# Patient Record
Sex: Female | Born: 1980 | Race: White | Hispanic: No | Marital: Married | Smoking: Former smoker
Health system: Southern US, Community
[De-identification: ages and names within clinical notes are randomized; demographics above are authoritative.]

## PROBLEM LIST (undated history)

## (undated) DIAGNOSIS — O1490 Unspecified pre-eclampsia, unspecified trimester: Secondary | ICD-10-CM

## (undated) DIAGNOSIS — E079 Disorder of thyroid, unspecified: Secondary | ICD-10-CM

## (undated) HISTORY — PX: WISDOM TOOTH EXTRACTION: SHX21

## (undated) HISTORY — DX: Disorder of thyroid, unspecified: E07.9

## (undated) HISTORY — DX: Unspecified pre-eclampsia, unspecified trimester: O14.90

---

## 2009-04-18 ENCOUNTER — Ambulatory Visit: Payer: Self-pay | Admitting: Family Medicine

## 2009-04-18 DIAGNOSIS — R5383 Other fatigue: Secondary | ICD-10-CM

## 2009-04-18 DIAGNOSIS — M25569 Pain in unspecified knee: Secondary | ICD-10-CM

## 2009-04-18 DIAGNOSIS — R5381 Other malaise: Secondary | ICD-10-CM | POA: Insufficient documentation

## 2009-04-21 ENCOUNTER — Encounter: Payer: Self-pay | Admitting: Family Medicine

## 2010-03-31 LAB — CONVERTED CEMR LAB: Pap Smear: NORMAL

## 2010-07-24 ENCOUNTER — Ambulatory Visit: Payer: Self-pay | Admitting: Family Medicine

## 2010-07-26 ENCOUNTER — Encounter: Payer: Self-pay | Admitting: Family Medicine

## 2010-07-27 LAB — CONVERTED CEMR LAB
ALT: 9 units/L (ref 0–35)
AST: 15 units/L (ref 0–37)
Alkaline Phosphatase: 34 units/L — ABNORMAL LOW (ref 39–117)
LDL Cholesterol: 103 mg/dL — ABNORMAL HIGH (ref 0–99)
Sodium: 139 meq/L (ref 135–145)
Total Bilirubin: 0.6 mg/dL (ref 0.3–1.2)
Total Protein: 7 g/dL (ref 6.0–8.3)
VLDL: 11 mg/dL (ref 0–40)

## 2010-08-29 ENCOUNTER — Encounter: Admission: RE | Admit: 2010-08-29 | Discharge: 2010-08-29 | Payer: Self-pay | Admitting: Family Medicine

## 2010-08-29 ENCOUNTER — Ambulatory Visit: Payer: Self-pay | Admitting: Family Medicine

## 2010-08-29 DIAGNOSIS — R030 Elevated blood-pressure reading, without diagnosis of hypertension: Secondary | ICD-10-CM | POA: Insufficient documentation

## 2010-10-31 NOTE — Assessment & Plan Note (Signed)
Summary: Chest pain, elevated BP   Vital Signs:  Patient profile:   30 year old female Height:      62.5 inches Weight:      141 pounds Pulse rate:   83 / minute BP sitting:   155 / 99  (right arm)  Vitals Entered By: Avon Gully CMA, Duncan Dull) (August 29, 2010 1:08 PM) CC: f/u BP and pt took an xray of her chest and saw something odd and now wants chest xray   Primary Care Provider:  Nani Gasser, MD  CC:  f/u BP and pt took an xray of her chest and saw something odd and now wants chest xray.  History of Present Illness: f/u BP and pt took an xray of her chest and saw something odd and now wants chest xray. Former smoker. Was initially having CP on the top of her lungs right and left.  Took an xray at work and saw a Chiropractor.  She is very nervous about it.  BP has been 120/70s.  She brought in a list of readings.  No SOB or CP. Works out regularly and does not have CP with this.   Current Medications (verified): 1)  None  Allergies (verified): No Known Drug Allergies  Comments:  Nurse/Medical Assistant: The patient's medications and allergies were reviewed with the patient and were updated in the Medication and Allergy Lists. Avon Gully CMA, Duncan Dull) (August 29, 2010 1:08 PM)  Physical Exam  General:  Well-developed,well-nourished,in no acute distress; alert,appropriate and cooperative throughout examination Head:  Normocephalic and atraumatic without obvious abnormalities. No apparent alopecia or balding. Chest Wall:  No deformities, masses, or tenderness noted. Lungs:  Normal respiratory effort, chest expands symmetrically. Lungs are clear to auscultation, no crackles or wheezes. Heart:  Normal rate and regular rhythm. S1 and S2 normal without gallop, murmur, click, rub or other extra sounds. Skin:  no rashes.   Psych:  Cognition and judgment appear intact. Alert and cooperative with normal attention span and concentration. No apparent delusions, illusions,  hallucinations   Impression & Recommendations:  Problem # 1:  DYSPNEA (ICD-786.05) Will get xray to further evaluate the lungs and ot rule out any problems. she is a former smoker.   Orders: T-Chest x-ray, 2 views (71020)  Problem # 2:  ELEVATED BP READING WITHOUT DX HYPERTENSION (ICD-796.2) Discussed recommend f/u in one month for BP recheck. Home BPs have been great.   Patient Instructions: 1)  We will call you with your results.   Orders Added: 1)  T-Chest x-ray, 2 views [71020] 2)  Est. Patient Level III [16109]

## 2010-10-31 NOTE — Assessment & Plan Note (Signed)
Summary: CPE   Vital Signs:  Patient profile:   30 year old female Height:      62.5 inches Weight:      138 pounds BMI:     24.93 Pulse rate:   94 / minute BP sitting:   145 / 98  (right arm) Cuff size:   regular  Vitals Entered By: Avon Gully CMA, Duncan Dull) (July 24, 2010 2:35 PM) CC: CPE   Primary Care Provider:  Nani Gasser, MD  CC:  CPE.  History of Present Illness: Energy level is better. She has lost some of her baby weight. She is doing welloverall. Here for CPE. Had her pap this summer. Saw Derm this summer as well.   Current Medications (verified): 1)  None  Allergies (verified): No Known Drug Allergies  Comments:  Nurse/Medical Assistant: The patient's medications and allergies were reviewed with the patient and were updated in the Medication and Allergy Lists. Avon Gully CMA, Duncan Dull) (July 24, 2010 2:35 PM)  Past History:  Past Surgical History: Wisdom teeth removed.   Family History: Father wtih HTN GM with HTN  Social History: Reviewed history from 04/18/2009 and no changes required. 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  Married to [employer].  with one son.   Former Smoker Alcohol use-yes Drug use-no Regular exercise-yes 2 caffeinated drinks per day.   Review of Systems  The patient denies anorexia, fever, weight loss, weight gain, vision loss, decreased hearing, hoarseness, chest pain, syncope, dyspnea on exertion, peripheral edema, prolonged cough, headaches, hemoptysis, abdominal pain, melena, hematochezia, severe indigestion/heartburn, hematuria, incontinence, genital sores, muscle weakness, suspicious skin lesions, transient blindness, difficulty walking, depression, unusual weight change, abnormal bleeding, enlarged lymph nodes, and breast masses.    Physical Exam  General:  Well-developed,well-nourished,in no acute distress; alert,appropriate and cooperative throughout examination Head:  Normocephalic  and atraumatic without obvious abnormalities. No apparent alopecia or balding. Eyes:  No corneal or conjunctival inflammation noted. EOMI. Perrla.  Ears:  External ear exam shows no significant lesions or deformities.  Otoscopic examination reveals clear canals, tympanic membranes are intact bilaterally without bulging, retraction, inflammation or discharge. Hearing is grossly normal bilaterally. Nose:  External nasal examination shows no deformity or inflammation. Nasal mucosa are pink and moist without lesions or exudates. Mouth:  Oral mucosa and oropharynx without lesions or exudates.  Teeth in good repair. Neck:  No deformities, masses, or tenderness noted. Chest Wall:  No deformities, masses, or tenderness noted. Lungs:  Normal respiratory effort, chest expands symmetrically. Lungs are clear to auscultation, no crackles or wheezes. Heart:  Normal rate and regular rhythm. S1 and S2 normal without gallop, murmur, click, rub or other extra sounds. Abdomen:  Bowel sounds positive,abdomen soft and non-tender without masses, organomegaly or hernias noted. Msk:  No deformity or scoliosis noted of thoracic or lumbar spine.   Pulses:  R and L carotid,radial,dorsalis pedis and posterior tibial pulses are full and equal bilaterally Extremities:  No clubbing, cyanosis, edema, or deformity noted with normal full range of motion of all joints.   Neurologic:  No cranial nerve deficits noted. Station and gait are normal. DTRs are symmetrical throughout. Sensory, motor and coordinative functions appear intact. Skin:  no rashes.   Cervical Nodes:  No lymphadenopathy noted Psych:  Cognition and judgment appear intact. Alert and cooperative with normal attention span and concentration. No apparent delusions, illusions, hallucinations   Impression & Recommendations:  Problem # 1:  HEALTH MAINTENANCE EXAM (ICD-V70.0) Exam is normal today.  Given  flu vac today.  Due for screening labs Healthy BMI. Keep up the  reg exercise.  Also has form for me to complet for her work once I recieve her labs.   Orders: T-Comprehensive Metabolic Panel [id number]) T-Lipid Profile [id number])  Other Orders: Flu Vaccine 69yrs + (01601) Admin 1st Vaccine (09323)   Patient Instructions: 1)  Google the DASH diet (.nih/org) for blood pressure. Check at work once a week and call with the numbers in one month.  2)  It is important that you exercise reguarly at least 20 minutes 5 times a week. If you develop chest pain, have severe difficulty breathing, or feel very tired, stop exercising immediately and seek medical attention.  3)  Take calcium +vitamin D daily.  4)  You were given a flu vaccine today.    Orders Added: 1)  T-Comprehensive Metabolic Panel [[id number]] 2)  T-Lipid Profile [[id number]] 3)  Flu Vaccine 59yrs + [[id number]] 4)  Admin 1st Vaccine [90471] 5)  Est. Patient age 5-39 [24]   Immunizations Administered:  Influenza Vaccine # 1:    Vaccine Type: Fluvax 3+    Site: right deltoid    Mfr: GlaxoSmithKline    Dose: 0.5 ml    Route: IM    Given by: Sue Lush McCrimmon CMA, (AAMA)    Exp. Date: 03/31/2011    Lot #: [id number]    VIS given: 04/25/10 version given July 24, 2010.  Flu Vaccine Consent Questions:    Do you have a history of severe allergic reactions to this vaccine? no    Any prior history of allergic reactions to egg and/or gelatin? no    Do you have a sensitivity to the preservative Thimersol? no    Do you have a past history of Guillan-Barre Syndrome? no    Do you currently have an acute febrile illness? no    Have you ever had a severe reaction to latex? no    Vaccine information given and explained to patient? no    Are you currently pregnant? no   Immunizations Administered:  Influenza Vaccine # 1:    Vaccine Type: Fluvax 3+    Site: right deltoid    Mfr: GlaxoSmithKline    Dose: 0.5 ml    Route: IM    Given by: Sue Lush McCrimmon CMA, (AAMA)    Exp. Date:  03/31/2011    Lot #: [id number]    VIS given: 04/25/10 version given July 24, 2010.  PAP Result Date:  03/31/2010 PAP Result:  normal

## 2011-06-14 IMAGING — CR DG CHEST 2V
2 series · 2 of 2 positions shown · non-contrast
Comparison: None

CLINICAL DATA: Shortness of breath, chest pain.

CHEST - 2 VIEW

[view not recorded (1 of 2)]
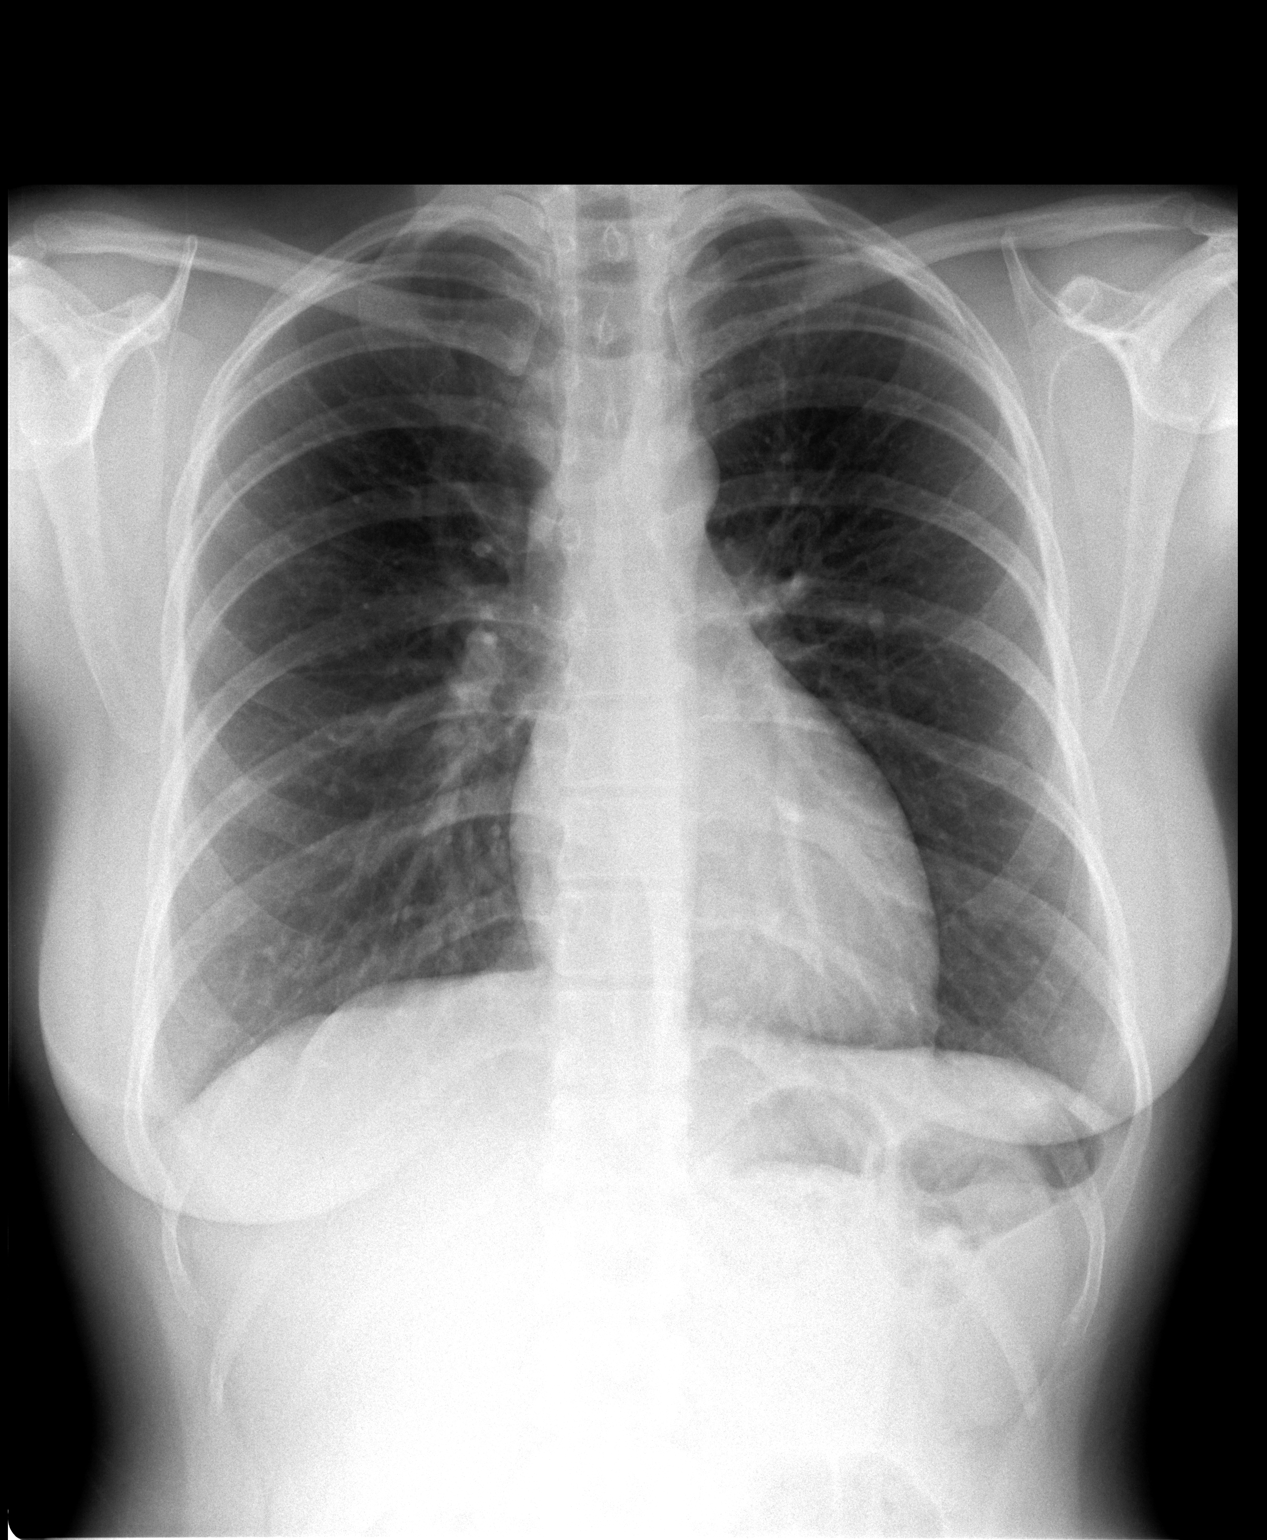

[view not recorded (2 of 2)]
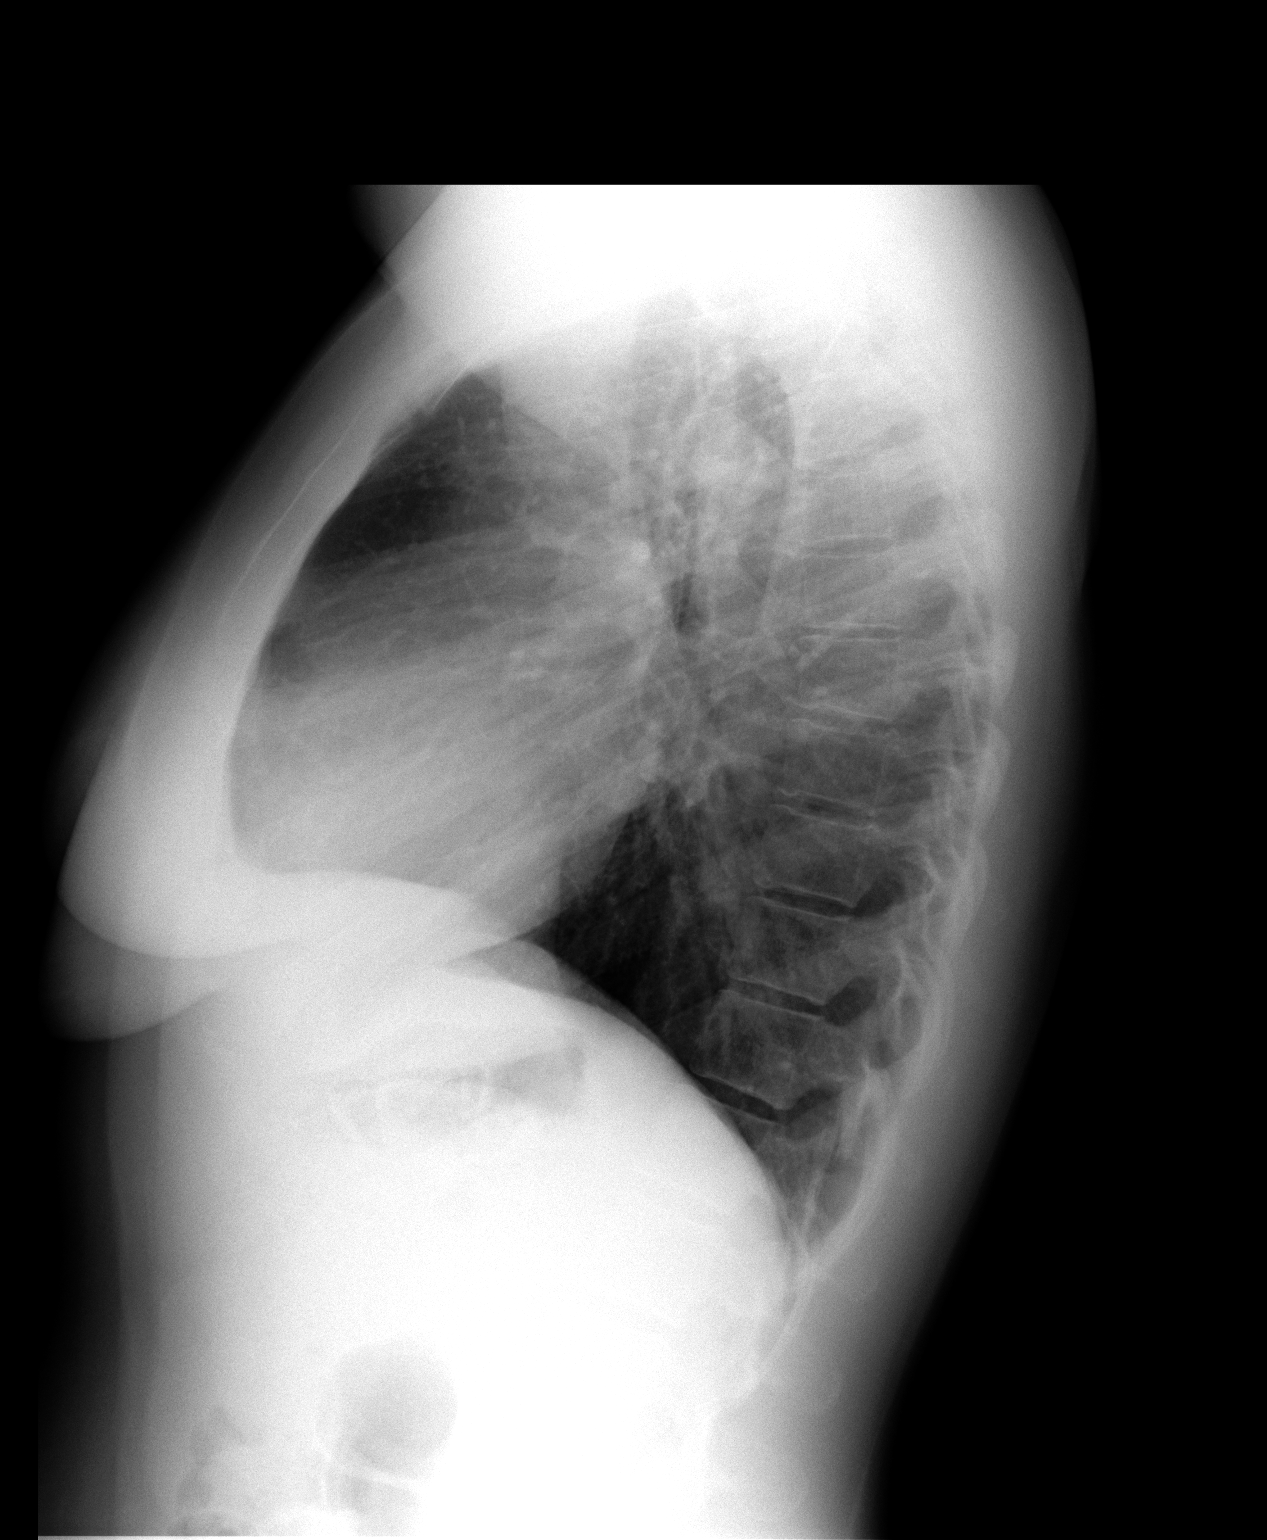

[2 of 2 positions shown; findings below may reference images not displayed]

FINDINGS: Heart and mediastinal contours are within normal limits.
No focal opacities or effusions.  No acute bony abnormality.
IMPRESSION: No active disease.

## 2011-12-13 ENCOUNTER — Encounter: Payer: Self-pay | Admitting: Family Medicine

## 2011-12-13 ENCOUNTER — Ambulatory Visit (INDEPENDENT_AMBULATORY_CARE_PROVIDER_SITE_OTHER): Payer: BC Managed Care – PPO | Admitting: Family Medicine

## 2011-12-13 VITALS — BP 153/96 | HR 84 | Temp 98.4°F | Ht 62.5 in | Wt 172.0 lb

## 2011-12-13 DIAGNOSIS — J40 Bronchitis, not specified as acute or chronic: Secondary | ICD-10-CM

## 2011-12-13 DIAGNOSIS — J329 Chronic sinusitis, unspecified: Secondary | ICD-10-CM

## 2011-12-13 MED ORDER — AZITHROMYCIN 250 MG PO TABS: ORAL_TABLET | ORAL | Status: AC

## 2011-12-13 NOTE — Patient Instructions (Signed)

## 2011-12-13 NOTE — Progress Notes (Signed)
  Subjective:    Patient ID: Christina Rivers, female    DOB: 1980/11/26, 31 y.o.   MRN: [id number]  HPI  Sinus sxs x 1 week. Bilat nasal congestion and pressure. Low grade fever that resolves with IBU.  + HA.  Mild St initially.  Cough that is worse at night.  Taking mucinex and sudafed and IBU. Using her sister cough medicine at night and has helped. Just had a  Baby 11 weeks ago. Did have pre-eclampsia during end of pregnancy.  Has been taking aldomet.  She is on a diet right now. Not nursing.  She has been wheezing, no hx of asthma.   Review of Systems     Objective:   Physical Exam  Constitutional: She is oriented to person, place, and time. She appears well-developed and well-nourished.  HENT:  Head: Normocephalic and atraumatic.  Right Ear: External ear normal.  Left Ear: External ear normal.  Nose: Nose normal.  Mouth/Throat: Oropharynx is clear and moist.       TMs and canals are clear.   Eyes: Conjunctivae and EOM are normal. Pupils are equal, round, and reactive to light.  Neck: Neck supple. No thyromegaly present.  Cardiovascular: Normal rate, regular rhythm and normal heart sounds.   Pulmonary/Chest: Effort normal and breath sounds normal. She has no wheezes.       Diffuse inspiratory wheeze.   Lymphadenopathy:    She has no cervical adenopathy.  Neurological: She is alert and oriented to person, place, and time.  Skin: Skin is warm and dry.  Psychiatric: She has a normal mood and affect.          Assessment & Plan:  Sinusitis/Bronchitis   - Willl tx with ABX.  Call if not better in one week. May still be viral but will tx since still having fevers. H.O. Given.   Elevated BP- Recheck in 2-3 weeks. She is off her aldomet.  Says home BPS have ranged from 120-140s.

## 2012-07-25 ENCOUNTER — Ambulatory Visit (INDEPENDENT_AMBULATORY_CARE_PROVIDER_SITE_OTHER): Payer: BC Managed Care – PPO | Admitting: Family Medicine

## 2012-07-25 ENCOUNTER — Encounter: Payer: Self-pay | Admitting: Family Medicine

## 2012-07-25 ENCOUNTER — Other Ambulatory Visit: Payer: Self-pay | Admitting: Family Medicine

## 2012-07-25 VITALS — BP 161/90 | HR 78 | Ht 64.0 in | Wt 156.0 lb

## 2012-07-25 DIAGNOSIS — R030 Elevated blood-pressure reading, without diagnosis of hypertension: Secondary | ICD-10-CM

## 2012-07-25 DIAGNOSIS — R42 Dizziness and giddiness: Secondary | ICD-10-CM

## 2012-07-25 DIAGNOSIS — R51 Headache: Secondary | ICD-10-CM

## 2012-07-25 NOTE — Progress Notes (Signed)
Subjective:    Patient ID: Christina Rivers, female    DOB: August 28, 1981, 31 y.o.   MRN: [id number]  HPI Dizziness on and off for months. Last week started getting heart palpiations for just a minute or two.  Was having occ HA as well.  Getting BP 130/85 at home. Then went to eye doc and got new glasses and her HA is better. Dizziness  Has persisted but is mild. Doesn't fall.  Says it feels like the room is spinning for a few seconds. She wonders if they could be hypoglycemic events. She has no prior history of diabetes or abnormal blood sugars..  Did an EKG at work, that was normal She brought a copy. .  Feels movement triggers it. Has dec her caffeine to one a day.  Not on NSAIDs. No CP or SOB.     Review of Systems In vision or hearing changes. No changes in speech, or mentation. She no longer smokes.    Objective:   Physical Exam  Constitutional: She is oriented to person, place, and time. She appears well-developed and well-nourished.  HENT:  Head: Normocephalic and atraumatic.  Right Ear: External ear normal.  Left Ear: External ear normal.  Nose: Nose normal.  Mouth/Throat: Oropharynx is clear and moist.       TMs and canals are clear.   Eyes: Conjunctivae normal and EOM are normal. Pupils are equal, round, and reactive to light.  Neck: Neck supple. No thyromegaly present.  Cardiovascular: Normal rate, regular rhythm and normal heart sounds.   Pulmonary/Chest: Effort normal and breath sounds normal. She has no wheezes.  Lymphadenopathy:    She has no cervical adenopathy.  Neurological: She is alert and oriented to person, place, and time. She has normal reflexes. No cranial nerve deficit. She exhibits normal muscle tone. Coordination normal.       Negative Dix-Hallpike maneuver.  Skin: Skin is warm and dry.  Psychiatric: She has a normal mood and affect. Her behavior is normal. Judgment and thought content normal.          Assessment & Plan:  Dizziness - Unclear etiology.   The EKG that she brought in from work was normal.  EKG shows rate of 62 bpm, NSR, normal axis.  Would like to check her lab work to rule out anemia and also check a thyroid level. We will also check electrolytes. The way she describes her symptoms it sounds consistent with benign positional vertigo but she had a negative Dix-Hallpike. Her blood pressure is elevated today. Repeat was about the same. 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 Certainly if they are elevated this could be contributing to her symptoms. That her machine is accurate. Most of her blood pressure readings are normal. She says she will keep an eye on this in the next week or 2 and call me with the results. She has had a recent eye exam. Headaches have resolved which is reassuring.  Elevated blood pressure - Her blood pressure is elevated today. Repeat was about the same. 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 Certainly if they are elevated this could be contributing to her symptoms.  That her machine is accurate. Most of her blood pressure readings are normal. She says she will keep an eye on this in the next week or 2 and call me with the results.

## 2012-07-25 NOTE — Patient Instructions (Addendum)
Please call me if have any more palpitations.

## 2012-07-26 LAB — CBC WITH DIFFERENTIAL/PLATELET
Basophils Absolute: 0 10*3/uL (ref 0.0–0.1)
Eosinophils Relative: 1 % (ref 0–5)
HCT: 40.9 % (ref 36.0–46.0)
Hemoglobin: 14.3 g/dL (ref 12.0–15.0)
Lymphocytes Relative: 35 % (ref 12–46)
MCV: 87.4 fL (ref 78.0–100.0)
Monocytes Absolute: 0.5 10*3/uL (ref 0.1–1.0)
Monocytes Relative: 8 % (ref 3–12)
RDW: 13 % (ref 11.5–15.5)
WBC: 5.9 10*3/uL (ref 4.0–10.5)

## 2012-07-26 LAB — URINALYSIS
Bilirubin Urine: NEGATIVE
Glucose, UA: NEGATIVE mg/dL
Ketones, ur: NEGATIVE mg/dL
Specific Gravity, Urine: 1.022 (ref 1.005–1.030)
Urobilinogen, UA: 0.2 mg/dL (ref 0.0–1.0)
pH: 8 (ref 5.0–8.0)

## 2012-07-26 LAB — COMPLETE METABOLIC PANEL WITH GFR
ALT: 10 U/L (ref 0–35)
AST: 13 U/L (ref 0–37)
Albumin: 4.7 g/dL (ref 3.5–5.2)
Calcium: 9.6 mg/dL (ref 8.4–10.5)
Chloride: 105 mEq/L (ref 96–112)
Potassium: 4.7 mEq/L (ref 3.5–5.3)

## 2012-07-26 LAB — FERRITIN: Ferritin: 25 ng/mL (ref 10–291)

## 2012-07-28 ENCOUNTER — Encounter: Payer: Self-pay | Admitting: *Deleted

## 2012-07-28 ENCOUNTER — Telehealth: Payer: Self-pay | Admitting: *Deleted

## 2012-07-28 LAB — T3, FREE: T3, Free: 2.8 pg/mL (ref 2.3–4.2)

## 2012-07-28 LAB — T4, FREE: Free T4: 0.88 ng/dL (ref 0.80–1.80)

## 2012-07-28 NOTE — Telephone Encounter (Signed)
Message copied by Florestine Avers on Mon Jul 28, 2012  4:11 PM ------      Message from: Nani Gasser D      Created: Mon Jul 28, 2012  3:40 PM       Call pt; additional thyroid tests are normal. Repeat panel in 1 month

## 2012-07-28 NOTE — Telephone Encounter (Signed)
Patient notified of additional labs and to repeat in one month

## 2012-08-18 ENCOUNTER — Other Ambulatory Visit: Payer: Self-pay | Admitting: Family Medicine

## 2012-08-18 ENCOUNTER — Telehealth: Payer: Self-pay | Admitting: *Deleted

## 2012-08-18 DIAGNOSIS — R5383 Other fatigue: Secondary | ICD-10-CM

## 2012-08-18 DIAGNOSIS — R790 Abnormal level of blood mineral: Secondary | ICD-10-CM

## 2012-08-18 NOTE — Telephone Encounter (Signed)
Lab entered

## 2012-08-18 NOTE — Telephone Encounter (Signed)
Refill

## 2012-08-20 LAB — TSH: TSH: 6.266 u[IU]/mL — ABNORMAL HIGH (ref 0.350–4.500)

## 2012-08-20 LAB — T4, FREE: Free T4: 0.86 ng/dL (ref 0.80–1.80)

## 2012-08-20 LAB — FERRITIN: Ferritin: 29 ng/mL (ref 10–291)

## 2012-08-21 ENCOUNTER — Telehealth: Payer: Self-pay | Admitting: *Deleted

## 2012-08-21 ENCOUNTER — Other Ambulatory Visit: Payer: Self-pay | Admitting: Family Medicine

## 2012-08-21 DIAGNOSIS — E039 Hypothyroidism, unspecified: Secondary | ICD-10-CM | POA: Insufficient documentation

## 2012-08-21 MED ORDER — LEVOTHROID 25 MCG PO TABS: 25.0000 ug | ORAL_TABLET | Freq: Every day | ORAL | Status: DC

## 2012-08-21 MED ORDER — LEVOTHYROXINE SODIUM 25 MCG PO TABS: 25.0000 ug | ORAL_TABLET | Freq: Every day | ORAL | Status: DC

## 2012-08-21 NOTE — Telephone Encounter (Signed)
Sent!

## 2012-08-21 NOTE — Telephone Encounter (Signed)
Pharmacy received a rx electronically for the Brand name Levothyroid , they do not have this med and haven't had for a long time. Want to know if you will send a script for the generic levothyroid 

## 2012-10-14 ENCOUNTER — Ambulatory Visit (INDEPENDENT_AMBULATORY_CARE_PROVIDER_SITE_OTHER): Payer: 59 | Admitting: Family Medicine

## 2012-10-14 ENCOUNTER — Encounter: Payer: Self-pay | Admitting: Family Medicine

## 2012-10-14 VITALS — BP 145/94 | HR 74 | Resp 16 | Wt 153.0 lb

## 2012-10-14 DIAGNOSIS — E611 Iron deficiency: Secondary | ICD-10-CM

## 2012-10-14 DIAGNOSIS — R030 Elevated blood-pressure reading, without diagnosis of hypertension: Secondary | ICD-10-CM

## 2012-10-14 DIAGNOSIS — D509 Iron deficiency anemia, unspecified: Secondary | ICD-10-CM

## 2012-10-14 DIAGNOSIS — E039 Hypothyroidism, unspecified: Secondary | ICD-10-CM

## 2012-10-14 NOTE — Progress Notes (Signed)
  Subjective:    Patient ID: Christina Rivers, female    DOB: 12-16-1980, 32 y.o.   MRN: [id number]  HPI Hypothyroid - Says feel well on new thyroid medication. Recent dx. Says was finally able to lose weight.  No recent skin or hair changes.    Elevated BP - has been in the 120s at home.   Pt denies chest pain, SOB, or heart palpitations.  Taking meds as directed w/o problems.  Denies medication side effects.  Still occc dizzy but has been better since has bee on her iron.   Iron deficiency - She has started her iron and says her dizziness is better. Tolerating it well.   Review of Systems     Objective:   Physical Exam  Constitutional: She is oriented to person, place, and time. She appears well-developed and well-nourished.  HENT:  Head: Normocephalic and atraumatic.  Neck: Neck supple. No thyromegaly present.  Cardiovascular: Normal rate, regular rhythm and normal heart sounds.   Pulmonary/Chest: Effort normal and breath sounds normal.  Lymphadenopathy:    She has no cervical adenopathy.  Neurological: She is alert and oriented to person, place, and time.  Skin: Skin is warm and dry.  Psychiatric: She has a normal mood and affect. Her behavior is normal.          Assessment & Plan:  Hypothyroid - Recheck TSH. Discussed her new dx and prognosis and medications. She is taking her med an hour before breakfast on an empty stomach.    Elevated BP- Home BPs are well controlled.  Ok to start birth control. Monitor BP on the medication and call if BPs are rising.  Has f/u with her gyn in about 2 weeks.   Iron def anemia - Due to recheck ferritin levels.  Will call with results. Tolerating iron well.

## 2012-10-18 ENCOUNTER — Other Ambulatory Visit: Payer: Self-pay | Admitting: Family Medicine

## 2012-12-05 ENCOUNTER — Ambulatory Visit: Payer: 59 | Admitting: Physician Assistant

## 2012-12-09 ENCOUNTER — Ambulatory Visit (INDEPENDENT_AMBULATORY_CARE_PROVIDER_SITE_OTHER): Payer: 59 | Admitting: Family Medicine

## 2012-12-09 ENCOUNTER — Encounter: Payer: Self-pay | Admitting: Family Medicine

## 2012-12-09 VITALS — BP 140/92 | HR 78 | Wt 154.0 lb

## 2012-12-09 NOTE — Progress Notes (Signed)
CC: Christina Rivers is a 32 y.o. female is here for Dizziness   Subjective: HPI:  Patient complains of on and off dizziness for the past one to 2 weeks. Is described as a mild sensation of the room spinning that lasts one to 2 seconds. It is always precipitated by sudden rotational maneuvers sitting up or lying down. She does not believe that it ever occurs after changing vertical positions. It does not occur if objects are moving around her in any particular trajectory. She's had this before in the past months to years ago but it only lasted a day and went away without intervention. It can occur anytime of the day. Nothing particularly makes it better or worse other than above. She complains of blurred vision when looking towards headlights oncoming traffic at night, has had some worsening farsightedness, denies monocular vision loss, denies double vision. Denies hearing loss, tinnitus, nor roaring in either ear. Denies recent viral upper respiratory illness. Denies fevers, chills, confusion, motor or sensory disturbances(other than above), palpitations, chest pain, shortness of breath, coordination difficulty, nor mental disturbance or concentration difficulty.  She's been taking her blood pressure at work and at home and reports consistent blood pressures well below 140/90 on average 120/70.   Review Of Systems Outlined In HPI  Past Medical History  Diagnosis Date  . Pre-eclampsia      Family History  Problem Relation Age of Onset  . Hypertension Father      History  Substance Use Topics  . Smoking status: Former Games developer  . Smokeless tobacco: Not on file  . Alcohol Use: No     Objective: Filed Vitals:   12/09/12 1554  BP: 140/92  Pulse: 78    General: Alert and Oriented, No Acute Distress HEENT: Pupils equal, round, reactive to light. Conjunctivae clear.  External ears unremarkable, canals clear with intact TMs with appropriate landmarks.  Middle ear appears open without  effusion. Pink inferior turbinates.  Moist mucous membranes, pharynx without inflammation nor lesions.  Neck supple without palpable lymphadenopathy nor abnormal masses. Tympanometry unremarkable. Lungs: Clear to auscultation bilaterally, no wheezing/ronchi/rales.  Comfortable work of breathing. Good air movement. Cardiac: Regular rate and rhythm. Normal S1/S2.  No murmurs, rubs, nor gallops.   Neuro: CN II-XII grossly intact, full strength/rom of all four extremities, C5/L4/S1 DTRs 2/4 bilaterally, gait normal, rapid alternating movements normal, heel-shin test normal, Rhomberg normal. Rinne test normal, Weber lateralizes to the right. Dix-Hallpike positive to the left Extremities: No peripheral edema.  Strong peripheral pulses.  Mental Status: No depression, anxiety, nor agitation. Skin: Warm and dry.  Assessment & Plan: [place] was seen today for dizziness.  Diagnoses and associated orders for this visit:  Benign paroxysmal positional vertigo    BPV: Discussed with patient that her symptoms are most likely do to BPV. Discussed warning signs that would be more suggestive of acoustic neuroma or more serious neurologic pathology. Patient was provided with a handout and instructions on modified Epley maneuver to the left to be performed 3 times a day and return 1 week if no improvement.  25 minutes spent face-to-face during visit today of which at least 50% was counseling or coordinating care regarding vertigo/BPV.   Return in about 1 week (around 12/16/2012).

## 2012-12-30 ENCOUNTER — Ambulatory Visit (INDEPENDENT_AMBULATORY_CARE_PROVIDER_SITE_OTHER): Payer: 59 | Admitting: Family Medicine

## 2012-12-30 ENCOUNTER — Encounter: Payer: Self-pay | Admitting: Family Medicine

## 2012-12-30 VITALS — BP 140/92 | HR 72 | Ht 64.0 in | Wt 154.0 lb

## 2012-12-30 DIAGNOSIS — H8110 Benign paroxysmal vertigo, unspecified ear: Secondary | ICD-10-CM

## 2012-12-30 NOTE — Patient Instructions (Signed)
Please call if you would like a referral for the vertigo.

## 2012-12-30 NOTE — Progress Notes (Signed)
  Subjective:    Patient ID: Christina Rivers, female    DOB: 09-30-1981, 32 y.o.   MRN: [id number]  HPI Here to f/u on vertigo. She was seen by my partner about 2 weeks ago for benign positional vertigo. She did have a positive Dix-Hallpike maneuver to the left at that time. He also did a very extensive neurologic exam to rule out any other concerns. She says that she is just concerned because she still having some mild symptoms even though overall it is improved. She did do the exercises that he gave her and she does feel it has helped. This is the second bout that she has had with vertigo and just wants to make sure that she does not have a brain tumor.  She denies any speech or hearing changes. She did recently get Says her vision has actually been better. She was producing having some difficulty focusing. She denies any new onset of headaches. No numbness or paresthesias in the limbs. No weakness.  She denies any ear pain or hearing loss.    Review of Systems     Objective:   Physical Exam  Constitutional: She is oriented to person, place, and time. She appears well-developed and well-nourished.  HENT:  Head: Normocephalic and atraumatic.  Right Ear: External ear normal.  Left Ear: External ear normal.  Nose: Nose normal.  Mouth/Throat: Oropharynx is clear and moist.  TMs and canals are clear.   Eyes: Conjunctivae and EOM are normal. Pupils are equal, round, and reactive to light.  Neck: Neck supple. No thyromegaly present.  Cardiovascular: Normal rate, regular rhythm and normal heart sounds.   Pulmonary/Chest: Effort normal and breath sounds normal. She has no wheezes.  Lymphadenopathy:    She has no cervical adenopathy.  Neurological: She is alert and oriented to person, place, and time. She has normal reflexes. She displays normal reflexes. No cranial nerve deficit. She exhibits normal muscle tone.  Pos to the left.  DiksHallpikes [employer]. Normal finger to nose. Normal heel to  shin. Negative Rhomberg.  Skin: Skin is warm and dry.  Psychiatric: She has a normal mood and affect.          Assessment & Plan:  Benign positional vertigo-I. gave her reassurance that neurologically everything is normal and intact. She does have a positive Dix Hallpike maneuver to the left today. Continue home exercises. If she's not continuing to completely resolve in the next couple weeks then please let me know and I will send her to physical therapy for vestibular rehabilitation. I gave her reassurance that I do not see anything on her exam is concerning for brain tumor. We reviewed like symptoms. Certainly if she experiences any of these please let me know.

## 2013-02-05 ENCOUNTER — Other Ambulatory Visit: Payer: Self-pay | Admitting: *Deleted

## 2013-02-05 MED ORDER — LEVOTHYROXINE SODIUM 25 MCG PO TABS: ORAL_TABLET | ORAL | Status: DC

## 2013-02-05 NOTE — Progress Notes (Signed)
Pt called stating that her Ins. Co will only pay for 90 day supply. I will change the rx to reflect this.Christina Rivers [place]

## 2013-04-17 ENCOUNTER — Encounter: Payer: Self-pay | Admitting: Family Medicine

## 2013-04-17 ENCOUNTER — Ambulatory Visit (INDEPENDENT_AMBULATORY_CARE_PROVIDER_SITE_OTHER): Payer: 59 | Admitting: Family Medicine

## 2013-04-17 VITALS — BP 134/89 | HR 75 | Ht 64.0 in | Wt 149.0 lb

## 2013-04-17 DIAGNOSIS — H539 Unspecified visual disturbance: Secondary | ICD-10-CM

## 2013-04-17 DIAGNOSIS — D509 Iron deficiency anemia, unspecified: Secondary | ICD-10-CM

## 2013-04-17 DIAGNOSIS — E039 Hypothyroidism, unspecified: Secondary | ICD-10-CM

## 2013-04-17 NOTE — Progress Notes (Signed)
  Subjective:    Patient ID: Christina Rivers, female    DOB: [birth year], 32 y.o.   MRN: [id number]  HPI Hypothyroid - had a couple of palpitations. Weight looks good.  Has been trying to loose wieght. No skin or hair change.   A month ago had pinkeye in her left eye. 2 weeks later got in her right eye.  Says eye just doesn't feel clear since then.  No drainage. No fever.  Feels like her eyelid is sticking to her eyeball.    Review of Systems     Objective:   Physical Exam  Constitutional: She is oriented to person, place, and time. She appears well-developed and well-nourished.  HENT:  Head: Normocephalic and atraumatic.  Eyes: Conjunctivae and EOM are normal. Pupils are equal, round, and reactive to light. Right eye exhibits no discharge. Left eye exhibits no discharge. No scleral icterus.  Neck: Neck supple. No thyromegaly present.  Cardiovascular: Normal rate, regular rhythm and normal heart sounds.   Pulmonary/Chest: Effort normal and breath sounds normal.  Lymphadenopathy:    She has no cervical adenopathy.  Neurological: She is alert and oriented to person, place, and time.  Skin: Skin is warm and dry.  Psychiatric: She has a normal mood and affect. Her behavior is normal.          Assessment & Plan:  Hypothyroid - will recheck TSH today.  Call if any changes.   Vision changes - Recommend schedule appt with ophtho. Given names to call.  Could be irits after pink eye infectin.

## 2013-04-18 LAB — CBC WITH DIFFERENTIAL/PLATELET
Basophils Absolute: 0 10*3/uL (ref 0.0–0.1)
Basophils Relative: 0 % (ref 0–1)
Eosinophils Absolute: 0.1 10*3/uL (ref 0.0–0.7)
Eosinophils Relative: 1 % (ref 0–5)
HCT: 40.9 % (ref 36.0–46.0)
Hemoglobin: 13.9 g/dL (ref 12.0–15.0)
Lymphocytes Relative: 36 % (ref 12–46)
Lymphs Abs: 1.9 10*3/uL (ref 0.7–4.0)
MCH: 30.3 pg (ref 26.0–34.0)
MCHC: 34 g/dL (ref 30.0–36.0)
MCV: 89.1 fL (ref 78.0–100.0)
Monocytes Absolute: 0.3 10*3/uL (ref 0.1–1.0)
Monocytes Relative: 7 % (ref 3–12)
Neutro Abs: 2.8 10*3/uL (ref 1.7–7.7)
Neutrophils Relative %: 56 % (ref 43–77)
Platelets: 185 10*3/uL (ref 150–400)
RBC: 4.59 MIL/uL (ref 3.87–5.11)
RDW: 13.7 % (ref 11.5–15.5)
WBC: 5.1 10*3/uL (ref 4.0–10.5)

## 2013-04-18 LAB — TSH: TSH: 18.547 u[IU]/mL — ABNORMAL HIGH (ref 0.350–4.500)

## 2013-04-18 LAB — FERRITIN: Ferritin: 26 ng/mL (ref 10–291)

## 2013-04-20 ENCOUNTER — Encounter: Payer: Self-pay | Admitting: Family Medicine

## 2013-04-20 ENCOUNTER — Other Ambulatory Visit: Payer: Self-pay | Admitting: *Deleted

## 2013-04-20 ENCOUNTER — Other Ambulatory Visit: Payer: Self-pay | Admitting: Family Medicine

## 2013-04-20 DIAGNOSIS — E039 Hypothyroidism, unspecified: Secondary | ICD-10-CM

## 2013-04-20 MED ORDER — LEVOTHYROXINE SODIUM 50 MCG PO TABS: 50.0000 ug | ORAL_TABLET | Freq: Every day | ORAL | Status: DC

## 2013-06-02 ENCOUNTER — Other Ambulatory Visit: Payer: Self-pay | Admitting: *Deleted

## 2013-06-02 DIAGNOSIS — E039 Hypothyroidism, unspecified: Secondary | ICD-10-CM

## 2013-06-03 ENCOUNTER — Telehealth: Payer: Self-pay | Admitting: *Deleted

## 2013-06-03 ENCOUNTER — Other Ambulatory Visit: Payer: Self-pay | Admitting: Family Medicine

## 2013-06-03 DIAGNOSIS — E039 Hypothyroidism, unspecified: Secondary | ICD-10-CM

## 2013-06-03 MED ORDER — LEVOTHYROXINE SODIUM 75 MCG PO TABS: 75.0000 ug | ORAL_TABLET | Freq: Every day | ORAL | Status: DC

## 2013-06-03 NOTE — Telephone Encounter (Signed)
Recheck tsh.Loralee Pacas [place]

## 2013-06-30 ENCOUNTER — Other Ambulatory Visit: Payer: Self-pay | Admitting: Family Medicine

## 2013-06-30 ENCOUNTER — Encounter: Payer: Self-pay | Admitting: Family Medicine

## 2013-06-30 DIAGNOSIS — E039 Hypothyroidism, unspecified: Secondary | ICD-10-CM

## 2013-07-02 NOTE — Progress Notes (Signed)
Quick Note:  All labs are normal. ______ 

## 2013-08-06 ENCOUNTER — Other Ambulatory Visit: Payer: Self-pay

## 2013-09-01 ENCOUNTER — Other Ambulatory Visit: Payer: Self-pay | Admitting: Family Medicine

## 2013-10-19 ENCOUNTER — Encounter: Payer: Self-pay | Admitting: Family Medicine

## 2013-10-20 ENCOUNTER — Other Ambulatory Visit: Payer: Self-pay | Admitting: Family Medicine

## 2013-10-20 DIAGNOSIS — E039 Hypothyroidism, unspecified: Secondary | ICD-10-CM

## 2013-10-22 LAB — TSH: TSH: 0.904 u[IU]/mL (ref 0.350–4.500)

## 2013-10-22 NOTE — Progress Notes (Signed)
Quick Note:  All labs are normal. ______ 

## 2013-11-27 ENCOUNTER — Other Ambulatory Visit: Payer: Self-pay | Admitting: Family Medicine

## 2014-03-04 ENCOUNTER — Other Ambulatory Visit: Payer: Self-pay | Admitting: Family Medicine

## 2014-03-16 ENCOUNTER — Encounter: Payer: Self-pay | Admitting: Family Medicine

## 2014-03-16 ENCOUNTER — Ambulatory Visit (INDEPENDENT_AMBULATORY_CARE_PROVIDER_SITE_OTHER): Payer: 59 | Admitting: Family Medicine

## 2014-03-16 VITALS — BP 151/94 | HR 76 | Ht 63.0 in | Wt 153.0 lb

## 2014-03-16 DIAGNOSIS — R790 Abnormal level of blood mineral: Secondary | ICD-10-CM

## 2014-03-16 DIAGNOSIS — R799 Abnormal finding of blood chemistry, unspecified: Secondary | ICD-10-CM

## 2014-03-16 DIAGNOSIS — Z0000 Encounter for general adult medical examination without abnormal findings: Secondary | ICD-10-CM

## 2014-03-16 LAB — COMPLETE METABOLIC PANEL WITH GFR
ALT: 9 U/L (ref 0–35)
AST: 14 U/L (ref 0–37)
Albumin: 4.6 g/dL (ref 3.5–5.2)
Alkaline Phosphatase: 40 U/L (ref 39–117)
BUN: 10 mg/dL (ref 6–23)
CALCIUM: 9.1 mg/dL (ref 8.4–10.5)
CHLORIDE: 102 meq/L (ref 96–112)
CO2: 25 mEq/L (ref 19–32)
CREATININE: 0.68 mg/dL (ref 0.50–1.10)
GFR, Est Non African American: >89 mL/min
Glucose, Bld: 83 mg/dL (ref 70–99)
Potassium: 4 mEq/L (ref 3.5–5.3)
Sodium: 137 mEq/L (ref 135–145)
Total Bilirubin: 0.6 mg/dL (ref 0.2–1.2)
Total Protein: 6.9 g/dL (ref 6.0–8.3)

## 2014-03-16 LAB — LIPID PANEL
CHOLESTEROL: 188 mg/dL (ref 0–200)
HDL: 56 mg/dL (ref >39)
LDL Cholesterol: 118 mg/dL — ABNORMAL HIGH (ref 0–99)
TRIGLYCERIDES: 70 mg/dL (ref <150)
Total CHOL/HDL Ratio: 3.4 Ratio
VLDL: 14 mg/dL (ref 0–40)

## 2014-03-16 MED ORDER — LEVOTHYROXINE SODIUM 75 MCG PO TABS: ORAL_TABLET | ORAL | Status: DC

## 2014-03-16 NOTE — Progress Notes (Signed)
Subjective:     Christina Rivers is a 33 y.o. female and is here for a comprehensive physical exam. The patient reports problems - questions about her thyroid.  she still has spells near her period where she will feel like her thyroid is not well-regulated. She'll actually little bit tachycardic and not feel well. She said it usually resolves after a few days. She's also had more difficulty losing weight. She runs for exercise pretty regularly  History   Social History  . Marital Status: Married    Spouse Name: N/A    Number of Children: N/A  . Years of Education: N/A   Occupational History  . Not on file.   Social History Main Topics  . Smoking status: Former Games developermoker  . Smokeless tobacco: Not on file  . Alcohol Use: No  . Drug Use: No  . Sexual Activity: Not on file   Other Topics Concern  . Not on file   Social History Narrative  . No narrative on file   Health Maintenance  Topic Date Due  . Pap Smear  10/01/2013  . Influenza Vaccine  05/01/2014  . Tetanus/tdap  10/01/2016    The following portions of the patient's history were reviewed and updated as appropriate: allergies, current medications, past family history, past medical history, past social history, past surgical history and problem list.  Review of Systems  A comprehensive review of systems was negative.   Objective:    There were no vitals taken for this visit. General appearance: alert, cooperative and appears stated age Head: Normocephalic, without obvious abnormality, atraumatic Eyes: conj clear, EOMi, PEERLA Ears: normal TM's and external ear canals both ears Nose: Nares normal. Septum midline. Mucosa normal. No drainage or sinus tenderness. Throat: lips, mucosa, and tongue normal; teeth and gums normal Neck: no adenopathy, no carotid bruit, no JVD, supple, symmetrical, trachea midline and thyroid not enlarged, symmetric, no tenderness/mass/nodules Back: symmetric, no curvature. ROM normal. No CVA  tenderness. Lungs: clear to auscultation bilaterally Breasts: not performed Heart: regular rate and rhythm, S1, S2 normal, no murmur, click, rub or gallop Abdomen: soft, non-tender; bowel sounds normal; no masses,  no organomegaly Pelvic: deferred Extremities: extremities normal, atraumatic, no cyanosis or edema Pulses: 2+ and symmetric Skin: Skin color, texture, turgor normal. No rashes or lesions Lymph nodes: Cervical, supraclavicular, and axillary nodes normal. Neurologic: Alert and oriented X 3, normal strength and tone. Normal symmetric reflexes. Normal coordination and gait    Assessment:    Healthy female exam.     Plan:     See After Visit Summary for Counseling Recommendations  Keep up a regular exercise program and make sure you are eating a healthy diet Try to eat 4 servings of dairy a day, or if you are lactose intolerant take a calcium with vitamin D daily.  Your vaccines are up to date.   Hypothyroid - doing well on current regimen. Has had some times near her period when she has some increased heart rate and not feeling well.   BP normally runs 120/80 at home.    Abnormal weight gain-since she does work out regularly encouraged her use a [employer] phone application like my [employer] to help her track however he is and set goals for weight loss. Recommend a local such as half a pound per week so that it's reasonable and she still feels well. If that is not successful then be happy to discuss other options with her at her followup appointment.

## 2014-03-16 NOTE — Patient Instructions (Signed)
Keep up a regular exercise program and make sure you are eating a healthy diet Try to eat 4 servings of dairy a day, or if you are lactose intolerant take a calcium with vitamin D daily.  Your vaccines are up to date.   

## 2014-03-17 ENCOUNTER — Encounter: Payer: Self-pay | Admitting: Family Medicine

## 2014-03-17 LAB — FERRITIN: Ferritin: 12 ng/mL (ref 10–291)

## 2014-03-17 LAB — TSH: TSH: 2.46 u[IU]/mL (ref 0.350–4.500)

## 2014-03-18 ENCOUNTER — Other Ambulatory Visit: Payer: Self-pay | Admitting: Family Medicine

## 2014-03-18 DIAGNOSIS — E039 Hypothyroidism, unspecified: Secondary | ICD-10-CM

## 2014-07-20 ENCOUNTER — Encounter: Payer: Self-pay | Admitting: Family Medicine

## 2014-07-20 ENCOUNTER — Ambulatory Visit (INDEPENDENT_AMBULATORY_CARE_PROVIDER_SITE_OTHER): Payer: 59 | Admitting: Family Medicine

## 2014-07-20 VITALS — BP 155/93 | HR 80 | Temp 98.4°F | Wt 153.0 lb

## 2014-07-20 DIAGNOSIS — Z23 Encounter for immunization: Secondary | ICD-10-CM

## 2014-07-20 DIAGNOSIS — R1310 Dysphagia, unspecified: Secondary | ICD-10-CM

## 2014-07-20 NOTE — Progress Notes (Signed)
CC: Christina Rivers is a 33 y.o. female is here for thyroid issues?   Subjective: HPI:  Complains of fullness in the back of her throat that has been present on a daily basis for the past 6-8 weeks. Symptoms began when she was experimenting with switching to what sounds to be porcine thyroid that was managed by a naturalist she was seen for symptoms that she was attributing to hypo-thyroidism. Symptoms of fatigue and difficulty losing weight were improved with taking this porcine thyroid preparation however she began to have a fullness in her throat and she believes that she was having some swelling of the neck. In late September she had a thyroid ultrasound which was unremarkable other than confirming Hashimoto's and finding benign nodules. She's done some more experiment and went back to levothyroxine however the fullness has not improved or worsened. She also has stopped taking multiple supplements including magnesium, vitamin D, vitamin K, vitamin E and has had no change in her symptoms. She saw endocrinology during this time and they believed that it was not due to her thyroid and rather silent reflux. She's tried 20 mg and later 40 mg of omeprazole each for at least a week at a time on a daily basis and had no change in her symptoms. She's also tried a variety of over-the-counter cough and cold medications and allergy medications without any change in her symptoms of fullness. Symptoms improved for a few days after she found out that her thyroid ultrasound was normal, symptoms were worse while she was waiting for the results of her thyroid ultrasound. Nothing else seems to make better or worse it is present on a daily basis, present all hours of the day. She denies regurgitation or choking. Denies unintentional weight loss, change in voice, nor difficulty breathing.  Review Of Systems Outlined In HPI  Past Medical History  Diagnosis Date  . Pre-eclampsia     Past Surgical History  Procedure  Laterality Date  . Wisdom tooth extraction     Family History  Problem Relation Age of Onset  . Hypertension Father     History   Social History  . Marital Status: Married    Spouse Name: N/A    Number of Children: N/A  . Years of Education: N/A   Occupational History  . Not on file.   Social History Main Topics  . Smoking status: Former Games developermoker  . Smokeless tobacco: Not on file  . Alcohol Use: No  . Drug Use: No  . Sexual Activity: Not on file   Other Topics Concern  . Not on file   Social History Narrative  . No narrative on file     Objective: BP 155/93  Pulse 80  Temp(Src) 98.4 F (36.9 C) (Oral)  Wt 153 lb (69.4 kg)  General: Alert and Oriented, No Acute Distress HEENT: Pupils equal, round, reactive to light. Conjunctivae clear.  External ears unremarkable, canals clear with intact TMs with appropriate landmarks.  Middle ear appears open without effusion. Pink inferior turbinates.  Moist mucous membranes, pharynx without inflammation nor lesions.  Neck supple without palpable lymphadenopathy nor abnormal masses. Lungs: Clear to auscultation bilaterally, no wheezing/ronchi/rales.  Comfortable work of breathing. Good air movement. Cardiac: Regular rate and rhythm. Normal S1/S2.  No murmurs, rubs, nor gallops.   Extremities: No peripheral edema.  Strong peripheral pulses.  Mental Status: No depression, anxiety, nor agitation. Skin: Warm and dry.  Assessment & Plan: Christina Rivers was seen today for thyroid issues?.  Diagnoses  and associated orders for this visit:  Dysphagia - DG Esophagus; Future  Encounter for immunization    Dysphasia: She's done quite a bit of research on this and brings up that she's even entertain the idea of this being completely benign, globus, and possibly psychologically influenced. This will be a diagnosis of exclusion and she is open to the idea of barium swallow study and if normal possibly even considering FEES. I will help arrange  these studies if needed once results are available.  Forwarding to PCP as Lorain ChildesFYI  Return if symptoms worsen or fail to improve.

## 2014-08-03 ENCOUNTER — Telehealth: Payer: Self-pay | Admitting: Family Medicine

## 2014-08-03 NOTE — Telephone Encounter (Signed)
Pt.notified

## 2014-08-03 NOTE — Telephone Encounter (Signed)
Christina Rivers, Will you please let patient know that her barium swallow test was normal which is reassuring.  If she's still experiencing any odd sensation when swallowing the next step would be a swallow study while being observed with a  Fiber optic camera.  Please let me know if she's interested.

## 2014-08-13 ENCOUNTER — Encounter: Payer: Self-pay | Admitting: Family Medicine

## 2014-08-13 ENCOUNTER — Ambulatory Visit (INDEPENDENT_AMBULATORY_CARE_PROVIDER_SITE_OTHER): Payer: 59 | Admitting: Family Medicine

## 2014-08-13 VITALS — BP 146/95 | HR 78 | Wt 153.0 lb

## 2014-08-13 DIAGNOSIS — R0989 Other specified symptoms and signs involving the circulatory and respiratory systems: Secondary | ICD-10-CM

## 2014-08-13 DIAGNOSIS — F458 Other somatoform disorders: Secondary | ICD-10-CM

## 2014-08-13 MED ORDER — CLONAZEPAM 0.5 MG PO TABS: 0.2500 mg | ORAL_TABLET | Freq: Every day | ORAL | Status: DC | PRN

## 2014-08-13 NOTE — Progress Notes (Signed)
CC: Christina Rivers is a 33 y.o. female is here for f/u results   Subjective: HPI:  Complains of continued fullness in the back of the throat. Since I saw her last she had a barium swallow study that was entirely normal. She is now confident that the swelling is only noticeable when she is anxious. She also notices that if she thinks about her swelling when she is not having any symptoms she can actually give herself symptoms. She denies any cough, shortness of breath, reflux, choking, nor difficulty swallowing. She still localizes the fullness in the lower throat. It has not gotten better or worse since I saw her last. She denies unintentional weight gain or loss.   Review Of Systems Outlined In HPI  Past Medical History  Diagnosis Date  . Pre-eclampsia     Past Surgical History  Procedure Laterality Date  . Wisdom tooth extraction     Family History  Problem Relation Age of Onset  . Hypertension Father     History   Social History  . Marital Status: Married    Spouse Name: N/A    Number of Children: N/A  . Years of Education: N/A   Occupational History  . Not on file.   Social History Main Topics  . Smoking status: Former Games developermoker  . Smokeless tobacco: Not on file  . Alcohol Use: No  . Drug Use: No  . Sexual Activity: Not on file   Other Topics Concern  . Not on file   Social History Narrative     Objective: BP 146/95 mmHg  Pulse 78  Wt 153 lb (69.4 kg)  General: Alert and Oriented, No Acute Distress HEENT: Pupils equal, round, reactive to light. Conjunctivae clear.  External ears unremarkable, canals clear with intact TMs with appropriate landmarks. Pink inferior turbinates.  Moist mucous membranes, pharynx without inflammation nor lesions.  Neck supple without palpable lymphadenopathy nor abnormal masses. Lungs: clear comfortable work of breathing Cardiac: Regular rate and rhythm.  Mental Status: No depression, anxiety, nor agitation. Skin: Warm and  dry.  Assessment & Plan: Christina Rivers was seen today for f/u results.  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    Globus: Discussed benign nature of this condition and that it may go it with time she stops focusing on it. Since symptoms have remained moderate in severity provided with low-dose clonazepam to take on an as-needed basis. Discussed that she should start taking this more than most days of the week she would be better off on a SSRI  Return if symptoms worsen or fail to improve.

## 2014-10-22 ENCOUNTER — Ambulatory Visit: Payer: Self-pay | Admitting: Family Medicine

## 2014-10-29 ENCOUNTER — Encounter: Payer: Self-pay | Admitting: Family Medicine

## 2014-10-29 ENCOUNTER — Ambulatory Visit (INDEPENDENT_AMBULATORY_CARE_PROVIDER_SITE_OTHER): Payer: 59 | Admitting: Family Medicine

## 2014-10-29 VITALS — BP 150/89 | HR 78 | Wt 158.0 lb

## 2014-10-29 DIAGNOSIS — R5382 Chronic fatigue, unspecified: Secondary | ICD-10-CM

## 2014-10-29 DIAGNOSIS — E039 Hypothyroidism, unspecified: Secondary | ICD-10-CM

## 2014-10-29 DIAGNOSIS — F458 Other somatoform disorders: Secondary | ICD-10-CM

## 2014-10-29 DIAGNOSIS — R0989 Other specified symptoms and signs involving the circulatory and respiratory systems: Secondary | ICD-10-CM

## 2014-10-29 DIAGNOSIS — E538 Deficiency of other specified B group vitamins: Secondary | ICD-10-CM | POA: Insufficient documentation

## 2014-10-29 LAB — T4: T4 TOTAL: 5.1 ug/dL (ref 4.5–12.0)

## 2014-10-29 NOTE — Progress Notes (Signed)
CC: Christina Rivers is a 34 y.o. female is here for f/u thyroid   Subjective: HPI:  Follow-up hypothyroidism: Has been taking 81 mg of nature thyroid on a daily basis. No known side effects. She had some questionable swelling of the neck when I saw her last however this has resolved now that she had a normal barium swallow study which has provided her with a great deal of reassurance. She's had difficulty with losing weight but denies any unintentional weight gain. No palpitations, skin or hair changes or GI disturbance.  Since I saw her last she had her vitamin B12 level checked by a physician outside of her office. There was 175 and deficient. She's been taking 5000 g of vitamin B12 on irregular basis but overall most days of the week. Since then she's noticed a big improvement with fatigue however she believes she has a mild element of fatigue. She denies any motor or sensory disturbances.   Review Of Systems Outlined In HPI  Past Medical History  Diagnosis Date  . Pre-eclampsia     Past Surgical History  Procedure Laterality Date  . Wisdom tooth extraction     Family History  Problem Relation Age of Onset  . Hypertension Father     History   Social History  . Marital Status: Married    Spouse Name: N/A    Number of Children: N/A  . Years of Education: N/A   Occupational History  . Not on file.   Social History Main Topics  . Smoking status: Former Games developermoker  . Smokeless tobacco: Not on file  . Alcohol Use: No  . Drug Use: No  . Sexual Activity: Not on file   Other Topics Concern  . Not on file   Social History Narrative     Objective: BP 150/89 mmHg  Pulse 78  Wt 158 lb (71.668 kg)  Vital signs reviewed. General: Alert and Oriented, No Acute Distress HEENT: Pupils equal, round, reactive to light. Conjunctivae clear.  External ears unremarkable.  Moist mucous membranes. No palpable thyromegaly. Lungs: Clear and comfortable work of breathing, speaking in full  sentences without accessory muscle use. Cardiac: Regular rate and rhythm.  Neuro: CN II-XII grossly intact, gait normal. Extremities: No peripheral edema.  Strong peripheral pulses.  Mental Status: No depression, anxiety, nor agitation. Logical though process. Skin: Warm and dry.  Assessment & Plan: Miranda was seen today for f/u thyroid.  Diagnoses and associated orders for this visit:  Hypothyroidism, unspecified hypothyroidism type - TSH - T4  Globus sensation  Vitamin B12 deficiency - Vitamin B12  Chronic fatigue - Ferritin    Hypothyroidism: Overall clinically controlled however difficult to losing weight could represent hypothyroidism she is due for TSH and T4 today. Globus sensation: Resolved Vitamin B12 deficiency: Recheck in vitamin B12 today Fatigue: Checking ferritin since this has been low in the past, currently she is not taking any iron supplements.  Return if symptoms worsen or fail to improve.

## 2014-10-30 LAB — FERRITIN: Ferritin: 17 ng/mL (ref 10–291)

## 2014-10-30 LAB — TSH: TSH: 1.61 u[IU]/mL (ref 0.350–4.500)

## 2014-10-30 LAB — VITAMIN B12: Vitamin B-12: 392 pg/mL (ref 211–911)

## 2014-11-02 ENCOUNTER — Ambulatory Visit: Payer: Self-pay | Admitting: Family Medicine

## 2014-11-26 ENCOUNTER — Encounter: Payer: Self-pay | Admitting: Family Medicine

## 2014-12-01 ENCOUNTER — Ambulatory Visit: Payer: Self-pay | Admitting: Family Medicine

## 2015-02-21 ENCOUNTER — Encounter: Payer: Self-pay | Admitting: Family Medicine

## 2015-02-21 ENCOUNTER — Ambulatory Visit (INDEPENDENT_AMBULATORY_CARE_PROVIDER_SITE_OTHER): Payer: 59 | Admitting: Family Medicine

## 2015-02-21 VITALS — BP 144/87 | HR 83 | Wt 148.0 lb

## 2015-02-21 DIAGNOSIS — J029 Acute pharyngitis, unspecified: Secondary | ICD-10-CM | POA: Diagnosis not present

## 2015-02-21 DIAGNOSIS — J020 Streptococcal pharyngitis: Secondary | ICD-10-CM

## 2015-02-21 LAB — POCT RAPID STREP A (OFFICE): Rapid Strep A Screen: POSITIVE — AB

## 2015-02-21 MED ORDER — PENICILLIN G BENZATHINE 1200000 UNIT/2ML IM SUSP
1.2000 10*6.[IU] | Freq: Once | INTRAMUSCULAR | Status: AC
  Administered 2015-02-21: 1.2 10*6.[IU] via INTRAMUSCULAR

## 2015-02-21 NOTE — Patient Instructions (Signed)

## 2015-02-21 NOTE — Progress Notes (Signed)
   Subjective:    Patient ID: Christina Rivers, female    DOB: 02-28-1981, 34 y.o.   MRN: [id number]  HPI ST x 4-5 days.  Says + exposure. Pain got worse last night.  Not sure if fever. NO chills or sweats. No cough or congestion. + HA today.   Review of Systems     Objective:   Physical Exam  Constitutional: She is oriented to person, place, and time. She appears well-developed and well-nourished.  HENT:  Head: Normocephalic and atraumatic.  Right Ear: External ear normal.  Left Ear: External ear normal.  Nose: Nose normal.  Mouth/Throat: Oropharynx is clear and moist.  TMs and canals are clear.   Eyes: Conjunctivae and EOM are normal. Pupils are equal, round, and reactive to light.  Neck: Neck supple. No thyromegaly present.  Cardiovascular: Normal rate, regular rhythm and normal heart sounds.   Pulmonary/Chest: Effort normal and breath sounds normal. She has no wheezes.  Lymphadenopathy:    She has no cervical adenopathy.  Neurological: She is alert and oriented to person, place, and time.  Skin: Skin is warm and dry.  Psychiatric: She has a normal mood and affect. Her behavior is normal.          Assessment & Plan:  ST- rapid strep  Positive. We'll treat with Bicillin 1.2 million units. Recommend that she change to pressures in about 2-3 days. Additional information handout provided. Continue Zyprexa and Aleve as needed over-the-counter for pain relief. Cough not better in one week.

## 2015-02-23 LAB — CULTURE, GROUP A STREP: ORGANISM ID, BACTERIA: NORMAL

## 2015-05-16 ENCOUNTER — Encounter: Payer: Self-pay | Admitting: Family Medicine

## 2015-05-17 ENCOUNTER — Telehealth: Payer: Self-pay | Admitting: Family Medicine

## 2015-05-17 DIAGNOSIS — E038 Other specified hypothyroidism: Secondary | ICD-10-CM

## 2015-05-17 MED ORDER — THYROID 81.25 MG PO TABS: ORAL_TABLET | ORAL | Status: DC

## 2015-05-17 NOTE — Telephone Encounter (Signed)
Hello Christina Rivers, As long as you have your thyroid hormone level checked every 6 months I'm perfectly fine with refilling this.  I'm sure Dr. Linford Arnold would be ok with it too.  Since she's technically your PCP I'm going to forward this message to her as well since chronic conditions like this should be managed by a patient's PCP.  I'm not placing any orders or refills quite yet, I'm going to see if Dr. Linford Arnold is ready to take over this. Take Care -Christina Rivers  ===View-only below this line===   ----- Message -----    FromDory Rivers    Sent: 05/16/2015  2:53 PM EDT      To: Christina Boom, DO Subject: Non-Urgent Medical Question  Hi Dr Ivan Anchors,  I had my last labs for thyroid in January. I'm coming up on a refill for my Naturethroid. The last person to prescribe that was the holistic dr I had seen previously. since then you and I had discussed just switching back to you guys for my thyroid treatment. I know you haven't prescribed the naurethroid for me before. Are you able to do that for me now or do I need to have an office visit? When my last labs were drawn with you, I was taking the 81.25 of Naturethroid.  Thanks,  Christina Rivers

## 2015-06-28 LAB — TSH: TSH: 2.553 u[IU]/mL (ref 0.350–4.500)

## 2015-09-20 ENCOUNTER — Encounter: Payer: Self-pay | Admitting: Family Medicine

## 2015-09-29 ENCOUNTER — Other Ambulatory Visit: Payer: Self-pay | Admitting: Family Medicine

## 2015-09-29 MED ORDER — THYROID 81.25 MG PO TABS: ORAL_TABLET | ORAL | Status: DC

## 2015-10-20 ENCOUNTER — Ambulatory Visit: Payer: Self-pay | Admitting: Family Medicine

## 2015-10-21 ENCOUNTER — Encounter: Payer: Self-pay | Admitting: Family Medicine

## 2015-10-21 ENCOUNTER — Ambulatory Visit (INDEPENDENT_AMBULATORY_CARE_PROVIDER_SITE_OTHER): Payer: 59 | Admitting: Family Medicine

## 2015-10-21 VITALS — BP 166/89 | HR 74 | Wt 156.0 lb

## 2015-10-21 DIAGNOSIS — D509 Iron deficiency anemia, unspecified: Secondary | ICD-10-CM

## 2015-10-21 DIAGNOSIS — F458 Other somatoform disorders: Secondary | ICD-10-CM | POA: Diagnosis not present

## 2015-10-21 DIAGNOSIS — E039 Hypothyroidism, unspecified: Secondary | ICD-10-CM

## 2015-10-21 DIAGNOSIS — R0989 Other specified symptoms and signs involving the circulatory and respiratory systems: Secondary | ICD-10-CM

## 2015-10-21 LAB — CBC
HEMATOCRIT: 42 % (ref 36.0–46.0)
Hemoglobin: 14 g/dL (ref 12.0–15.0)
MCH: 29.5 pg (ref 26.0–34.0)
MCHC: 33.3 g/dL (ref 30.0–36.0)
MCV: 88.4 fL (ref 78.0–100.0)
MPV: 12.3 fL (ref 8.6–12.4)
PLATELETS: 176 10*3/uL (ref 150–400)
RBC: 4.75 MIL/uL (ref 3.87–5.11)
RDW: 13.2 % (ref 11.5–15.5)
WBC: 6.1 10*3/uL (ref 4.0–10.5)

## 2015-10-21 MED ORDER — CLONAZEPAM 0.5 MG PO TABS: 0.2500 mg | ORAL_TABLET | Freq: Every day | ORAL | Status: AC | PRN

## 2015-10-21 MED ORDER — THYROID 81.25 MG PO TABS: ORAL_TABLET | ORAL | Status: DC

## 2015-10-21 NOTE — Progress Notes (Signed)
   Subjective:    Patient ID: Christina Rivers, female    DOB: 01-May-1981, 35 y.o.   MRN: [id number]  HPI  Is here today to follow-up for anxiety. She occasionally gets a globus sensation when her anxiety is high. She takes usually takes about half of a tab of clonazepam and that works well for her. She was given a prescription for 20 tabs over a year ago and says she still has a few left. She says she is very cautious about how often she uses it. She does report feeling nervous and anxious several days of the week.  Hypothyroidism-no recent skin or hair changes. Energy levels are good. She feels that the medication is effective and has finally balanced out and is working well for her.  She was diagnosed with iron deficiency anemia about a year ago. She has been taking her iron and wants to have her levels rechecked today.  Review of Systems     Objective:   Physical Exam  Constitutional: She is oriented to person, place, and time. She appears well-developed and well-nourished.  HENT:  Head: Normocephalic and atraumatic.  Neck: Neck supple. No thyromegaly present.  Cardiovascular: Normal rate, regular rhythm and normal heart sounds.   Pulmonary/Chest: Effort normal and breath sounds normal.  Lymphadenopathy:    She has no cervical adenopathy.  Neurological: She is alert and oriented to person, place, and time.  Skin: Skin is warm and dry.  Psychiatric: She has a normal mood and affect. Her behavior is normal.          Assessment & Plan:   globus sensation-GAD 7 score of 2 today which looks fantastic. She rates her symptoms as not problematic at all. PHQ 9 score of 0. Refilled clonazepam today for when necessary use. Reminded her about potential for misuse and abuse.  Hypothyroidism-we'll recheck TSH in the next couple of months. Refills sent to the pharmacy. Call if any palms or concerns. Next  Iron deficiency anemia-due to recheck ferritin and iron levels.

## 2015-10-22 LAB — IRON AND TIBC
%SAT: 33 % (ref 11–50)
Iron: 115 ug/dL (ref 40–190)
TIBC: 347 ug/dL (ref 250–450)
UIBC: 232 ug/dL (ref 125–400)

## 2015-10-22 LAB — TSH: TSH: 5.382 u[IU]/mL — AB (ref 0.350–4.500)

## 2015-10-22 LAB — FERRITIN: Ferritin: 19 ng/mL (ref 10–291)

## 2015-10-22 LAB — T4, FREE: FREE T4: 0.92 ng/dL (ref 0.80–1.80)

## 2015-10-26 ENCOUNTER — Other Ambulatory Visit: Payer: Self-pay | Admitting: Family Medicine

## 2015-10-26 MED ORDER — THYROID 97.5 MG PO TABS: 1.0000 | ORAL_TABLET | ORAL | Status: DC

## 2015-11-01 NOTE — Addendum Note (Signed)
Addended by: Deno Etienne on: 11/01/2015 09:33 AM   Modules accepted: Orders

## 2015-11-28 ENCOUNTER — Other Ambulatory Visit: Payer: Self-pay

## 2015-11-28 ENCOUNTER — Encounter: Payer: Self-pay | Admitting: Family Medicine

## 2015-11-28 DIAGNOSIS — E039 Hypothyroidism, unspecified: Secondary | ICD-10-CM

## 2015-12-21 ENCOUNTER — Other Ambulatory Visit: Payer: Self-pay | Admitting: *Deleted

## 2015-12-21 MED ORDER — THYROID 97.5 MG PO TABS: 1.0000 | ORAL_TABLET | ORAL | Status: DC

## 2016-01-25 LAB — TSH: TSH: 1.81 mIU/L

## 2016-03-05 ENCOUNTER — Ambulatory Visit (INDEPENDENT_AMBULATORY_CARE_PROVIDER_SITE_OTHER): Payer: 59 | Admitting: Physician Assistant

## 2016-03-05 ENCOUNTER — Encounter: Payer: Self-pay | Admitting: Physician Assistant

## 2016-03-05 VITALS — BP 161/91 | HR 64 | Ht 63.0 in | Wt 156.0 lb

## 2016-03-05 DIAGNOSIS — I10 Essential (primary) hypertension: Secondary | ICD-10-CM

## 2016-03-05 MED ORDER — LISINOPRIL-HYDROCHLOROTHIAZIDE 20-25 MG PO TABS: 1.0000 | ORAL_TABLET | Freq: Every day | ORAL | Status: DC

## 2016-03-05 NOTE — Patient Instructions (Signed)
Hydrochlorothiazide, HCTZ; Lisinopril tablets What is this medicine? HYDROCHLOROTHIAZIDE; LISINOPRIL (hye droe klor oh THYE a zide; lyse IN oh pril) is a combination of a diuretic and an ACE inhibitor. It is used to treat high blood pressure. This medicine may be used for other purposes; ask your health care provider or pharmacist if you have questions. What should I tell my health care provider before I take this medicine? They need to know if you have any of these conditions: -bone marrow disease -decreased urine -heart or blood vessel disease -if you are on a special diet like a low salt diet -immune system problems, like lupus -kidney disease -liver disease -previous swelling of the tongue, face, or lips with difficulty breathing, difficulty swallowing, hoarseness, or tightening of the throat -recent heart attack or stroke -an unusual or allergic reaction to lisinopril, hydrochlorothiazide, sulfa drugs, other medicines, insect venom, foods, dyes, or preservatives -pregnant or trying to get pregnant -breast-feeding How should I use this medicine? Take this medicine by mouth with a glass of water. Follow the directions on the prescription label. You can take it with or without food. If it upsets your stomach, take it with food. Take your medicine at regular intervals. Do not take it more often than directed. Do not stop taking except on your doctor's advice. Talk to your pediatrician regarding the use of this medicine in children. Special care may be needed. Overdosage: If you think you have taken too much of this medicine contact a poison control center or emergency room at once. NOTE: This medicine is only for you. Do not share this medicine with others. What if I miss a dose? If you miss a dose, take it as soon as you can. If it is almost time for your next dose, take only that dose. Do not take double or extra doses. What may interact with this medicine? -barbiturates like  phenobarbital -blood pressure medicines -corticosteroids like prednisone -diabetic medications -diuretics, especially triamterene, spironolactone or amiloride -lithium -NSAIDs like ibuprofen -potassium salts or potassium supplements -prescription pain medicines -skeletal muscle relaxants like tubocurarine -some cholesterol lowering medications like cholestyramine or colestipol This list may not describe all possible interactions. Give your health care provider a list of all the medicines, herbs, non-prescription drugs, or dietary supplements you use. Also tell them if you smoke, drink alcohol, or use illegal drugs. Some items may interact with your medicine. What should I watch for while using this medicine? Visit your doctor or health care professional for regular checks on your progress. Check your blood pressure as directed. Ask your doctor or health care professional what your blood pressure should be and when you should contact him or her. Call your doctor or health care professional if you notice an irregular or fast heart beat. You must not get dehydrated. Ask your doctor or health care professional how much fluid you need to drink a day. Check with him or her if you get an attack of severe diarrhea, nausea and vomiting, or if you sweat a lot. The loss of too much body fluid can make it dangerous for you to take this medicine. Women should inform their doctor if they wish to become pregnant or think they might be pregnant. There is a potential for serious side effects to an unborn child. Talk to your health care professional or pharmacist for more information. You may get drowsy or dizzy. Do not drive, use machinery, or do anything that needs mental alertness until you know how this drug   affects you. Do not stand or sit up quickly, especially if you are an older patient. This reduces the risk of dizzy or fainting spells. Alcohol can make you more drowsy and dizzy. Avoid alcoholic drinks. This  medicine may affect your blood sugar level. If you have diabetes, check with your doctor or health care professional before changing the dose of your diabetic medicine. Avoid salt substitutes unless you are told otherwise by your doctor or health care professional. This medicine can make you more sensitive to the sun. Keep out of the sun. If you cannot avoid being in the sun, wear protective clothing and use sunscreen. Do not use sun lamps or tanning beds/booths. Do not treat yourself for coughs, colds, or pain while you are taking this medicine without asking your doctor or health care professional for advice. Some ingredients may increase your blood pressure. What side effects may I notice from receiving this medicine? Side effects that you should report to your doctor or health care professional as soon as possible: -changes in vision -confusion, dizziness, light headedness or fainting spells -decreased amount of urine passed -difficulty breathing or swallowing, hoarseness, or tightening of the throat -eye pain -fast or irregular heart beat, palpitations, or chest pain -muscle cramps -nausea and vomiting -persistent dry cough -redness, blistering, peeling or loosening of the skin, including inside the mouth -stomach pain -swelling of your face, lips, tongue, hands, or feet -unusual rash, bleeding or bruising, or pinpoint red spots on the skin -worsened gout pain -yellowing of the eyes or skin Side effects that usually do not require medical attention (report to your doctor or health care professional if they continue or are bothersome): -change in sex drive or performance -cough -headache This list may not describe all possible side effects. Call your doctor for medical advice about side effects. You may report side effects to FDA at 1-800-FDA-1088. Where should I keep my medicine? Keep out of the reach of children. Store at room temperature between 20 and 25 degrees C (68 and 77  degrees F). Protect from moisture and excessive light. Keep container tightly closed. Throw away any unused medicine after the expiration date. NOTE: This sheet is a summary. It may not cover all possible information. If you have questions about this medicine, talk to your doctor, pharmacist, or health care provider.    2016, [employer]. (2010-06-07 13:33:52)  

## 2016-03-05 NOTE — Progress Notes (Signed)
   Subjective:    Patient ID: Christina Rivers, female    DOB: June 22, 1981, 35 y.o.   MRN: [id number]  HPI  Pt is a 35 yo female who presents to the clinic for BP recheck. She was seen in urgent care on 03/04/16 in [facility] with BP of 147/96 and dull headache. She was given HCTZ to start but was concerned due to her sulfa allergy. She did not start it and called UC back they then sent over norvasc. She got appt for today and did not start either. She has hx of borderline BP elevation but never been on medication. She used to keep check of BP at home and was around 120/80. Her BP cuff broke a month ago. Last week she checked using her fathers cuff and was 160/116. She took ASA 81mg  and went to bed. Noticed she has had more of a dull HA. Denies any CP, palpitations, vision changes, or dizziness. Father and grandparents both have HTN.   Review of Systems  All other systems reviewed and are negative.      Objective:   Physical Exam  Constitutional: She is oriented to person, place, and time. She appears well-developed and well-nourished.  HENT:  Head: Normocephalic and atraumatic.  Cardiovascular: Normal rate, regular rhythm and normal heart sounds.   Pulmonary/Chest: Effort normal and breath sounds normal. She has no wheezes.  Neurological: She is alert and oriented to person, place, and time.  Psychiatric: She has a normal mood and affect. Her behavior is normal.          Assessment & Plan:  HTN- recheck went down a little. Compared our reading with her cuff and her cuff was elevated by about 10 points systolic and diasytolic from out reading. Started lisionpril/HCTZ. Discussed not likely to have reaction due to sulfa allergy. Discussed signs and symptoms of allergic reaction and to take benadryl 50mg  and stop medication if occurs. Follow up in ER with SOB, lip swelling. Follow up with PCP in 2 weeks.

## 2016-03-13 ENCOUNTER — Encounter: Payer: Self-pay | Admitting: Physician Assistant

## 2016-03-14 ENCOUNTER — Other Ambulatory Visit: Payer: Self-pay | Admitting: Physician Assistant

## 2016-03-14 MED ORDER — LOSARTAN POTASSIUM-HCTZ 100-25 MG PO TABS: 1.0000 | ORAL_TABLET | Freq: Every day | ORAL | Status: DC

## 2016-03-27 ENCOUNTER — Encounter: Payer: Self-pay | Admitting: Family Medicine

## 2016-03-27 ENCOUNTER — Ambulatory Visit (INDEPENDENT_AMBULATORY_CARE_PROVIDER_SITE_OTHER): Payer: 59 | Admitting: Family Medicine

## 2016-03-27 VITALS — BP 126/74 | HR 75 | Wt 153.0 lb

## 2016-03-27 DIAGNOSIS — R030 Elevated blood-pressure reading, without diagnosis of hypertension: Secondary | ICD-10-CM | POA: Diagnosis not present

## 2016-03-27 DIAGNOSIS — Z114 Encounter for screening for human immunodeficiency virus [HIV]: Secondary | ICD-10-CM

## 2016-03-27 DIAGNOSIS — I10 Essential (primary) hypertension: Secondary | ICD-10-CM | POA: Diagnosis not present

## 2016-03-27 NOTE — Progress Notes (Signed)
Subjective:    CC: HTN  HPI:  Hypertension-she was recently started on losartan HCTZ. She tried taking lisinopril but it caused a cough. She is doing really well on this medication. Her only concern is that sometimes in the afternoon she'll start to get a headache and when she checks her blood pressure is actually low in the 90s. She admits she doesn't drink a lot of fluids. But she has otherwise liked being on the medication. She actually says she feels like it really helps her overall in how she feels day day. No chest pain or shortness of breath or palpitations.  Past medical history, Surgical history, Family history not pertinant except as noted below, Social history, Allergies, and medications have been entered into the medical record, reviewed, and corrections made.   Review of Systems: No fevers, chills, night sweats, weight loss, chest pain, or shortness of breath.   Objective:    General: Well Developed, well nourished, and in no acute distress.  Neuro: Alert and oriented x3, extra-ocular muscles intact, sensation grossly intact.  HEENT: Normocephalic, atraumatic  Skin: Warm and dry, no rashes. Cardiac: Regular rate and rhythm, no murmurs rubs or gallops, no lower extremity edema.  Respiratory: Clear to auscultation bilaterally. Not using accessory muscles, speaking in full sentences.   Impression and Recommendations:    Hypertension-looks absolutely fantastic today. Did encourage her to try to hydrate a little bit more because it does have a diuretic especially since blood pressures are dropping a little on the afternoon. She can also try cutting the tablet in half and taking half a tab twice a day to see if this helps. If she still continues to have midday headaches and drops in blood pressure that we may need to take increase the diuretic component of her medication. EMP today.

## 2016-04-27 LAB — COMPLETE METABOLIC PANEL WITH GFR
ALBUMIN: 4.6 g/dL (ref 3.6–5.1)
ALK PHOS: 39 U/L (ref 33–115)
ALT: 13 U/L (ref 6–29)
AST: 13 U/L (ref 10–30)
BUN: 17 mg/dL (ref 7–25)
CO2: 26 mmol/L (ref 20–31)
Calcium: 9.2 mg/dL (ref 8.6–10.2)
Chloride: 104 mmol/L (ref 98–110)
Creat: 0.77 mg/dL (ref 0.50–1.10)
GLUCOSE: 91 mg/dL (ref 65–99)
POTASSIUM: 4.2 mmol/L (ref 3.5–5.3)
Sodium: 140 mmol/L (ref 135–146)
Total Bilirubin: 0.5 mg/dL (ref 0.2–1.2)
Total Protein: 6.9 g/dL (ref 6.1–8.1)

## 2016-04-27 LAB — HIV ANTIBODY (ROUTINE TESTING W REFLEX): HIV: NONREACTIVE

## 2016-04-27 LAB — LIPID PANEL
CHOL/HDL RATIO: 3.8 ratio (ref <=5.0)
Cholesterol: 198 mg/dL (ref 125–200)
HDL: 52 mg/dL (ref >=46)
LDL Cholesterol: 128 mg/dL (ref <130)
TRIGLYCERIDES: 89 mg/dL (ref <150)
VLDL: 18 mg/dL (ref <30)

## 2016-04-28 ENCOUNTER — Other Ambulatory Visit: Payer: Self-pay | Admitting: Physician Assistant

## 2016-05-02 ENCOUNTER — Other Ambulatory Visit: Payer: Self-pay | Admitting: *Deleted

## 2016-05-02 MED ORDER — LOSARTAN POTASSIUM-HCTZ 100-25 MG PO TABS
1.0000 | ORAL_TABLET | Freq: Every day | ORAL | 4 refills | Status: DC
Start: 2016-05-02 — End: 2016-05-09

## 2016-05-09 ENCOUNTER — Encounter: Payer: Self-pay | Admitting: Family Medicine

## 2016-05-09 MED ORDER — LOSARTAN POTASSIUM-HCTZ 100-25 MG PO TABS: 1.0000 | ORAL_TABLET | Freq: Every day | ORAL | 1 refills | Status: DC

## 2016-07-08 ENCOUNTER — Other Ambulatory Visit: Payer: Self-pay | Admitting: Family Medicine

## 2016-07-16 ENCOUNTER — Other Ambulatory Visit: Payer: Self-pay

## 2016-07-16 MED ORDER — THYROID 90 MG PO TABS: 90.0000 mg | ORAL_TABLET | Freq: Every day | ORAL | 0 refills | Status: DC

## 2016-08-06 ENCOUNTER — Ambulatory Visit (INDEPENDENT_AMBULATORY_CARE_PROVIDER_SITE_OTHER): Payer: 59 | Admitting: Obstetrics & Gynecology

## 2016-08-06 ENCOUNTER — Encounter: Payer: Self-pay | Admitting: Obstetrics & Gynecology

## 2016-08-06 VITALS — BP 126/84 | HR 84 | Resp 16 | Ht 64.0 in | Wt 164.0 lb

## 2016-08-06 DIAGNOSIS — Z1151 Encounter for screening for human papillomavirus (HPV): Secondary | ICD-10-CM | POA: Diagnosis not present

## 2016-08-06 DIAGNOSIS — Z124 Encounter for screening for malignant neoplasm of cervix: Secondary | ICD-10-CM | POA: Diagnosis not present

## 2016-08-06 DIAGNOSIS — Z01419 Encounter for gynecological examination (general) (routine) without abnormal findings: Secondary | ICD-10-CM | POA: Diagnosis not present

## 2016-08-06 NOTE — Progress Notes (Signed)
Subjective:    Christina Rivers is a 35 y.o. MW P2 (287 yo son and 414 yo daughter) female who presents for an annual exam. The patient has no complaints today. The patient is sexually active. GYN screening history: last pap: was normal. The patient wears seatbelts: yes. The patient participates in regular exercise: yes. Has the patient ever been transfused or tattooed?: yes. The patient reports that there is not domestic violence in her life.   Menstrual History: OB History    Gravida Para Term Preterm AB Living   2 2 2     2    SAB TAB Ectopic Multiple Live Births                  Menarche age: [age] Patient's last menstrual period was 07/21/2016.    The following portions of the patient's history were reviewed and updated as appropriate: allergies, current medications, past family history, past medical history, past social history, past surgical history and problem list.  Review of Systems Pertinent items are noted in HPI.   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 Married for 13 years, Denies dysparuenia, husband had vasectomy Periods monthly, bleeding better in the last 3 month FH- no breast/gyn/colon cancer   Objective:    BP 126/84   Pulse 84   Resp 16   Ht 5\' 4"  (1.626 m)   Wt 164 lb (74.4 kg)   LMP 07/21/2016   BMI 28.15 kg/m   General Appearance:    Alert, cooperative, no distress, appears stated age  Head:    Normocephalic, without obvious abnormality, atraumatic  Eyes:    PERRL, conjunctiva/corneas clear, EOM's intact, fundi    benign, both eyes  Ears:    Normal TM's and external ear canals, both ears  Nose:   Nares normal, septum midline, mucosa normal, no drainage    or sinus tenderness  Throat:   Lips, mucosa, and tongue normal; teeth and gums normal  Neck:   Supple, symmetrical, trachea midline, no adenopathy;    thyroid:  no enlargement/tenderness/nodules; no carotid   bruit or JVD  Back:     Symmetric, no curvature, ROM normal, no CVA tenderness  Lungs:     Clear  to auscultation bilaterally, respirations unlabored  Chest Wall:    No tenderness or deformity   Heart:    Regular rate and rhythm, S1 and S2 normal, no murmur, rub   or gallop  Breast Exam:    No tenderness, masses, or nipple abnormality  Abdomen:     Soft, non-tender, bowel sounds active all four quadrants,    no masses, no organomegaly  Genitalia:    Normal female without lesion, discharge or tenderness, NSSR, NT, mobile, normal adnexal exam     Extremities:   Extremities normal, atraumatic, no cyanosis or edema  Pulses:   2+ and symmetric all extremities  Skin:   Skin color, texture, turgor normal, no rashes or lesions  Lymph nodes:   Cervical, supraclavicular, and axillary nodes normal  Neurologic:   CNII-XII intact, normal strength, sensation and reflexes    throughout   .    Assessment:    Healthy female exam.    Plan:     Breast self exam technique reviewed and patient encouraged to perform self-exam monthly. Thin prep Pap smear. with cotesting

## 2016-08-07 ENCOUNTER — Telehealth: Payer: Self-pay

## 2016-08-07 LAB — VITAMIN D 25 HYDROXY (VIT D DEFICIENCY, FRACTURES): VIT D 25 HYDROXY: 23 ng/mL — AB (ref 30–100)

## 2016-08-07 MED ORDER — VITAMIN D (ERGOCALCIFEROL) 1.25 MG (50000 UNIT) PO CAPS: 50000.0000 [IU] | ORAL_CAPSULE | ORAL | 0 refills | Status: AC

## 2016-08-07 NOTE — Telephone Encounter (Signed)
Spoke with pt and she is aware that her vitamin d is low and that it has been sent to her pharmacy for her to take and to return in eight weeks to recheck it

## 2016-08-07 NOTE — Addendum Note (Signed)
Addended by: Kathie DikeSOLA, Aiyonna Lucado J on: 08/07/2016 08:38 AM   Modules accepted: Orders

## 2016-08-08 LAB — CYTOLOGY - PAP
ADEQUACY: ABSENT
Diagnosis: NEGATIVE
HPV: NOT DETECTED

## 2016-08-26 ENCOUNTER — Other Ambulatory Visit: Payer: Self-pay | Admitting: Family Medicine

## 2016-09-05 ENCOUNTER — Telehealth: Payer: Self-pay | Admitting: *Deleted

## 2016-09-05 DIAGNOSIS — E039 Hypothyroidism, unspecified: Secondary | ICD-10-CM

## 2016-09-05 NOTE — Telephone Encounter (Signed)
Received notification from the pharmacy that armour thyroid is very expensive and patint would like to go back to nature -throid 65 mg. Is this ok?

## 2016-09-05 NOTE — Telephone Encounter (Signed)
Ok to change back, she will have to recheck TSH in 4 weeks.

## 2016-09-07 MED ORDER — THYROID 65 MG PO TABS: ORAL_TABLET | ORAL | 0 refills | Status: DC

## 2016-09-07 MED ORDER — THYROID 65 MG PO TABS: 65.0000 mg | ORAL_TABLET | Freq: Every day | ORAL | 0 refills | Status: DC

## 2016-09-07 NOTE — Telephone Encounter (Signed)
Spoke w/Brenda she stated that nature throid is on back order and has been for a long time. Asked if I was calling the correct pharmacy. She stated that the order was coming from the [facility] in [place].  Called and spoke w/Kathleen and gave her a VO for pt's Rx for Nature throid.Loralee PBlack & DeckeracasBarkley, Bulah Lurie [place]

## 2016-09-07 NOTE — Telephone Encounter (Signed)
Called pt and informed her of recommendations. Order faxed.  Pt wanted to know if she should be taking 1.5 of the Nature thyroid since this is not the same equivalent of the Armour thyroid? I told her that I would fwd to pcp for advice and call back.Loralee PacasBarkley, Azai Gaffin [place]

## 2016-09-07 NOTE — Telephone Encounter (Signed)
Called pt and informed her of recommendations.Christina Rivers  

## 2016-09-11 ENCOUNTER — Encounter: Payer: Self-pay | Admitting: Obstetrics & Gynecology

## 2016-11-03 ENCOUNTER — Other Ambulatory Visit: Payer: Self-pay | Admitting: Family Medicine

## 2016-11-19 ENCOUNTER — Encounter: Payer: Self-pay | Admitting: Family Medicine

## 2016-11-23 ENCOUNTER — Other Ambulatory Visit: Payer: Self-pay | Admitting: *Deleted

## 2016-11-23 ENCOUNTER — Encounter: Payer: Self-pay | Admitting: Family Medicine

## 2016-11-23 ENCOUNTER — Other Ambulatory Visit: Payer: Self-pay | Admitting: Family Medicine

## 2016-11-23 DIAGNOSIS — E039 Hypothyroidism, unspecified: Secondary | ICD-10-CM

## 2016-11-23 LAB — TSH: TSH: 5.51 m[IU]/L — AB

## 2016-11-23 MED ORDER — THYROID 120 MG PO TABS: 120.0000 mg | ORAL_TABLET | Freq: Every day | ORAL | 0 refills | Status: DC

## 2017-02-03 ENCOUNTER — Other Ambulatory Visit: Payer: Self-pay | Admitting: Family Medicine

## 2017-02-10 ENCOUNTER — Encounter: Payer: Self-pay | Admitting: Family Medicine

## 2017-03-09 LAB — TSH: TSH: 0.01 mIU/L — ABNORMAL LOW

## 2017-03-12 ENCOUNTER — Encounter: Payer: Self-pay | Admitting: Family Medicine

## 2017-03-12 ENCOUNTER — Other Ambulatory Visit: Payer: Self-pay | Admitting: *Deleted

## 2017-03-12 MED ORDER — THYROID 120 MG PO TABS: ORAL_TABLET | ORAL | 1 refills | Status: AC

## 2017-03-12 NOTE — Progress Notes (Signed)
Pharmacy changed.Christina Rivers  

## 2017-04-17 ENCOUNTER — Encounter: Payer: Self-pay | Admitting: Family Medicine

## 2017-04-17 MED ORDER — LOSARTAN POTASSIUM-HCTZ 100-25 MG PO TABS: 1.0000 | ORAL_TABLET | Freq: Every day | ORAL | 0 refills | Status: AC
